# Patient Record
Sex: Female | Born: 1985 | Race: White | Hispanic: No | Marital: Married | Smoking: Former smoker
Health system: Southern US, Community
[De-identification: ages and names within clinical notes are randomized; demographics above are authoritative.]

## PROBLEM LIST (undated history)

## (undated) DIAGNOSIS — O24419 Gestational diabetes mellitus in pregnancy, unspecified control: Secondary | ICD-10-CM

---
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
## 2007-05-04 ENCOUNTER — Encounter: Admission: RE | Admit: 2007-05-04 | Discharge: 2007-05-04 | Payer: Self-pay | Admitting: Obstetrics and Gynecology

## 2007-05-21 ENCOUNTER — Ambulatory Visit: Payer: Self-pay | Admitting: Obstetrics & Gynecology

## 2007-05-28 ENCOUNTER — Ambulatory Visit: Payer: Self-pay | Admitting: Obstetrics and Gynecology

## 2007-06-04 ENCOUNTER — Ambulatory Visit: Payer: Self-pay | Admitting: Gynecology

## 2007-06-09 ENCOUNTER — Ambulatory Visit: Payer: Self-pay | Admitting: Obstetrics & Gynecology

## 2007-06-16 ENCOUNTER — Ambulatory Visit: Payer: Self-pay | Admitting: Gynecology

## 2007-06-20 ENCOUNTER — Encounter (INDEPENDENT_AMBULATORY_CARE_PROVIDER_SITE_OTHER): Payer: Self-pay | Admitting: Obstetrics and Gynecology

## 2007-06-20 ENCOUNTER — Inpatient Hospital Stay (HOSPITAL_COMMUNITY): Admission: AD | Admit: 2007-06-20 | Discharge: 2007-06-23 | Payer: Self-pay | Admitting: Obstetrics and Gynecology

## 2007-09-08 ENCOUNTER — Inpatient Hospital Stay (HOSPITAL_COMMUNITY): Admission: AD | Admit: 2007-09-08 | Discharge: 2007-09-08 | Payer: Self-pay | Admitting: Obstetrics and Gynecology

## 2010-07-23 NOTE — Op Note (Signed)
NAME:  Sara Macias, Sara Macias               ACCOUNT NO.:  [id number]   MEDICAL RECORD NO.:  [id number]          PATIENT TYPE:  INP   LOCATION:                                FACILITY:  [facility]   PHYSICIAN:  Juluis Mire, M.D.   DATE OF BIRTH:  1985/04/12   DATE OF PROCEDURE:  06/20/2007  DATE OF DISCHARGE:                               OPERATIVE REPORT   PREOPERATIVE DIAGNOSES:  1. Twin pregnancy at 35-1/2 weeks, with a breech transverse lie.  2. Spontaneous onset of labor.  3. Gestational diabetes.   POSTOPERATIVE DIAGNOSES:  1. Twin pregnancy at 35-1/2 weeks, with a breech transverse lie.  2. Spontaneous onset of labor.  3. Gestational diabetes.   OPERATIVE PROCEDURE:  Low transverse cesarean section.   SURGEON:  Juluis Mire, M.D.   ANESTHESIA:  Spinal.   ESTIMATED BLOOD LOSS:  500-600 mL.   PACKS AND DRAINS:  None.   INTRAOPERATIVE BLOOD REPLACEMENT:  None.   COMPLICATIONS:  None.   INDICATIONS:  The patient is a 25 year old primigravida female, with  twin gestation at 35-1/2 weeks; presenting with spontaneous onset of  labor.  Ultrasound confirmed breech transverse presentation.  The  decision was to proceed with a primary cesarean section.  The risks were  discussed, including the risk of infection, the risk of hemorrhage that  could require transfusion, with the risk of AIDS or hepatitis; the risk  of injury to adjacent organs, including bladder, bowel, ureters that  could require further exploratory surgery.  The risk of deep venous  thrombosis and pulmonary emboli.  The patient expressed understanding of  the indications and risks.   PROCEDURE:  The patient was taken to the OR and placed in the supine  position with a left lateral tilt.  After satisfactory level of spinal  anesthesia was obtained, the abdomen was prepped out with Betadine and  draped as a sterile field.  A low transverse skin incision was made with  a knife and carried through subcutaneous tissue.   The fascia was entered  sharply and the incision extended laterally.  The fascia was taken off  the muscle superiorly and inferiorly.  The rectus muscles were separated  in the midline.  The perineum was then sharply incised, and the perineum  extended both superiorly and inferiorly.  A bladder flap was developed.  A low transverse uterine incision was begun with a knife, and extended  laterally using manual traction.   Twin A presented in the breech presentation and delivered in the usual  manner.  Twin A was a female, who weighed 5 pounds 12 ounces; Apgars  were 8/8. Umbilical artery pH was 7.35.   Twin B was transverse; converted to a vertex.  Delivered with elevation  of head and fundal pressure.  That twin was a female, who weighed 5 pounds  15 ounces; Apgars were 9/9.  Umbilical artery pH was 7.36.   The placenta was delivered manually and sent to pathology.  The uterus  was exteriorized for closure.  The uterus was closed with a running,  locking suture of 0 Chromic --  using the 2-layer closure technique.  We  had good hemostasis.  Tubes and ovaries were unremarkable.  The uterus  was returned to the abdominal cavity.  The pelvic cavity was irrigated.  We had good hemostasis and clear urine output.  Muscles and peritoneum  were closed with running suture of 3-0 Vicryl.  The fascia was closed  with running suture of 0 PDS.  The skin was closed with staples and  Steri-Strips.  Sponge, instrument and needle count was reported correct  by circulating nurse x2.  Foley catheter remained clear at time of  closure.  The patient tolerated the procedure well and was returned to  the recovery room in good condition.      Juluis Mire, M.D.  Electronically Signed     JSM/MEDQ  D:  06/20/2007  T:  06/20/2007  Job:  [id number]

## 2010-07-26 NOTE — Discharge Summary (Signed)
Sara Macias, Sara Macias               ACCOUNT NO.:  [id number]   MEDICAL RECORD NO.:  [id number]          PATIENT TYPE:  INP   LOCATION:  9124                          FACILITY:  [facility]   PHYSICIAN:  Duke Salvia. Marcelle Overlie, M.D.DATE OF BIRTH:  1985/07/01   DATE OF ADMISSION:  06/20/2007  DATE OF DISCHARGE:  06/23/2007                               DISCHARGE SUMMARY   ADMITTING DIAGNOSES:  1. Intrauterine pregnancy at 35-1/2 weeks estimated gestational age.  2. Spontaneous onset of labor.  3. Twin gestation for gestational diabetes, diet-controlled.   DISCHARGE DIAGNOSES:  1. Status post low-transverse cesarean section.  2. Viable female and female twin infants.   PROCEDURE:  Primary low-transverse cesarean section.   REASON FOR ADMISSION:  Please see written H and P.   HOSPITAL COURSE:  The patient is a 25 year old primigravida that was  admitted to [facility] at 35-1/2 weeks estimated  gestational age with spontaneous onset of labor.  The patient had been  given terbutaline which she did not respond to.  Ultrasound was  performed which revealed that twin A was in the breech presentation.  Decision was made to proceed with a primary low-transverse cesarean  section.  The patient was then taken to the operating room where spinal  anesthesia was administered without difficulty.  A low-transverse  incision was made with the delivery of a viable twin A female infant  weighing 5 pounds 12 ounces and Apgars of 8 at 1 minute and 9 at 5  minutes.  Arterial cord pH was 7.35.  Also twin B female infant weighing 5  pounds 15 ounces and Apgars of 9 at 1 minute and 9 at 5 minutes.  Arterial cord pH was 7.36.  The patient tolerated the procedure well and  was taken to the recovery room in stable condition.  On postoperative  day #1, the patient did complain of some hunger, otherwise vital signs  were stable.  She was afebrile.  Abdomen soft with good return of bowel  function.  Fundus was  firm and nontender.  Abdominal dressing was noted  to be clean, dry, and intact.  Foley was draining with adequate amount  of urine output.  Laboratory findings revealed hemoglobin of 11.0,  platelet count of 98,000, WBC count of 10.1.  Motrin was held.  CBC was  ordered for the following morning and fasting and 2-hour postprandial  labs were ordered.  On the following morning, the patient was without  complaint.  Vital signs were stable.  She was afebrile.  Abdomen was  slightly distended, however, the patient was passing adequate amounts of  flatus.  Fundus was firm and nontender.  Abdominal dressing was noted to  be clean, dry, and intact.  Laboratory findings revealed a hemoglobin of  11.9, platelet count of 124,000, WBC count of 11.5, fasting blood sugar  was 94 mg/dL.  The patient was given a Dulcolax suppository and  increased warm beverages with ambulation on postoperative day #3.  The  patient was now without complaint.  Vital signs were stable.  She was  afebrile.  Abdomen was soft.  Fundus firm and nontender.  Incision was  clean, dry, and intact.  Staples were removed and the patient was later  discharged home.   CONDITION ON DISCHARGE:  Stable.   DIET:  Regular as tolerated.   ACTIVITY:  No heavy lifting, no driving x2 weeks, no vaginal entry.   FOLLOWUP:  The patient is to follow up in the office in 1-2 weeks for an  incision check.  She is to call for a temperature greater than 100  degrees, persistent nausea, vomiting, heavy vaginal bleeding, and/or  redness or drainage from the incisional site.   DISCHARGE MEDICATIONS:  1. Tylox #30 one p.o. every 4-6 hours p.r.n.  2. Motrin 600 mg every 6 hours.  3. Prenatal vitamins 1 p.o. daily.  4. Colace 1 p.o. daily.      Julio Sicks, N.P.      Richard M. Marcelle Overlie, M.D.  Electronically Signed    CC/MEDQ  D:  07/28/2007  T:  07/28/2007  Job:  [id number]

## 2010-12-03 LAB — URINALYSIS, ROUTINE W REFLEX MICROSCOPIC
Glucose, UA: NEGATIVE
Nitrite: NEGATIVE
Specific Gravity, Urine: 1.01
pH: 6.5

## 2010-12-03 LAB — CBC
HCT: 35.2 — ABNORMAL LOW
MCHC: 34.9
MCHC: 34.9
MCV: 84
MCV: 85.1
Platelets: 110 — ABNORMAL LOW
Platelets: 124 — ABNORMAL LOW
Platelets: 98 — ABNORMAL LOW
RBC: 3.76 — ABNORMAL LOW
RBC: 4.14
RDW: 15.4
WBC: 10.1
WBC: 11.5 — ABNORMAL HIGH

## 2010-12-03 LAB — URINE MICROSCOPIC-ADD ON

## 2011-07-28 ENCOUNTER — Other Ambulatory Visit: Payer: Self-pay | Admitting: Dermatology

## 2011-08-11 ENCOUNTER — Other Ambulatory Visit: Payer: Self-pay | Admitting: Dermatology

## 2013-09-25 ENCOUNTER — Other Ambulatory Visit: Payer: Self-pay

## 2013-10-05 ENCOUNTER — Ambulatory Visit: Payer: BC Managed Care – PPO

## 2013-10-28 ENCOUNTER — Other Ambulatory Visit (HOSPITAL_COMMUNITY): Payer: Self-pay | Admitting: Obstetrics and Gynecology

## 2013-10-28 DIAGNOSIS — O283 Abnormal ultrasonic finding on antenatal screening of mother: Secondary | ICD-10-CM

## 2013-11-04 ENCOUNTER — Ambulatory Visit (HOSPITAL_COMMUNITY)
Admission: RE | Admit: 2013-11-04 | Discharge: 2013-11-04 | Disposition: A | Discharge status: Home / Self Care | Payer: BC Managed Care – PPO | Source: Ambulatory Visit | Attending: Obstetrics and Gynecology | Admitting: Obstetrics and Gynecology

## 2013-11-04 ENCOUNTER — Encounter (HOSPITAL_COMMUNITY): Payer: Self-pay

## 2013-11-04 VITALS — BP 117/69 | HR 76 | Wt 230.0 lb

## 2013-11-04 DIAGNOSIS — Z794 Long term (current) use of insulin: Secondary | ICD-10-CM | POA: Insufficient documentation

## 2013-11-04 DIAGNOSIS — O283 Abnormal ultrasonic finding on antenatal screening of mother: Secondary | ICD-10-CM

## 2013-11-04 DIAGNOSIS — O99340 Other mental disorders complicating pregnancy, unspecified trimester: Secondary | ICD-10-CM | POA: Insufficient documentation

## 2013-11-04 DIAGNOSIS — O358XX0 Maternal care for other (suspected) fetal abnormality and damage, not applicable or unspecified: Secondary | ICD-10-CM | POA: Insufficient documentation

## 2013-11-04 DIAGNOSIS — O289 Unspecified abnormal findings on antenatal screening of mother: Secondary | ICD-10-CM | POA: Diagnosis not present

## 2013-11-04 DIAGNOSIS — O24419 Gestational diabetes mellitus in pregnancy, unspecified control: Secondary | ICD-10-CM

## 2013-11-04 HISTORY — DX: Gestational diabetes mellitus in pregnancy, unspecified control: O24.419

## 2013-11-26 ENCOUNTER — Encounter (HOSPITAL_COMMUNITY): Payer: BC Managed Care – PPO | Admitting: Anesthesiology

## 2013-11-26 ENCOUNTER — Encounter (HOSPITAL_COMMUNITY): Payer: Self-pay

## 2013-11-26 ENCOUNTER — Inpatient Hospital Stay (HOSPITAL_COMMUNITY): Payer: BC Managed Care – PPO | Admitting: Anesthesiology

## 2013-11-26 ENCOUNTER — Encounter (HOSPITAL_COMMUNITY)
Admission: AD | Disposition: A | Discharge status: Home / Self Care | Payer: Self-pay | Source: Ambulatory Visit | Attending: Obstetrics and Gynecology

## 2013-11-26 ENCOUNTER — Inpatient Hospital Stay (HOSPITAL_COMMUNITY)
Admission: AD | Admit: 2013-11-26 | Discharge: 2013-11-29 | DRG: 766 | Disposition: A | Discharge status: Home / Self Care | Payer: BC Managed Care – PPO | Source: Ambulatory Visit | Attending: Obstetrics and Gynecology | Admitting: Obstetrics and Gynecology

## 2013-11-26 DIAGNOSIS — O4290 Premature rupture of membranes, unspecified as to length of time between rupture and onset of labor, unspecified weeks of gestation: Secondary | ICD-10-CM | POA: Diagnosis present

## 2013-11-26 DIAGNOSIS — Z3490 Encounter for supervision of normal pregnancy, unspecified, unspecified trimester: Secondary | ICD-10-CM

## 2013-11-26 DIAGNOSIS — O34219 Maternal care for unspecified type scar from previous cesarean delivery: Secondary | ICD-10-CM | POA: Diagnosis present

## 2013-11-26 DIAGNOSIS — O99814 Abnormal glucose complicating childbirth: Secondary | ICD-10-CM | POA: Diagnosis present

## 2013-11-26 DIAGNOSIS — Z87891 Personal history of nicotine dependence: Secondary | ICD-10-CM | POA: Diagnosis not present

## 2013-11-26 DIAGNOSIS — E669 Obesity, unspecified: Secondary | ICD-10-CM | POA: Diagnosis present

## 2013-11-26 DIAGNOSIS — Z6837 Body mass index (BMI) 37.0-37.9, adult: Secondary | ICD-10-CM | POA: Diagnosis not present

## 2013-11-26 DIAGNOSIS — O99214 Obesity complicating childbirth: Secondary | ICD-10-CM

## 2013-11-26 LAB — CBC WITH DIFFERENTIAL/PLATELET
BASOS ABS: 0 10*3/uL (ref 0.0–0.1)
BASOS PCT: 0 % (ref 0–1)
EOS ABS: 0.1 10*3/uL (ref 0.0–0.7)
Eosinophils Relative: 1 % (ref 0–5)
HCT: 34.7 % — ABNORMAL LOW (ref 36.0–46.0)
Hemoglobin: 11.9 g/dL — ABNORMAL LOW (ref 12.0–15.0)
Lymphocytes Relative: 14 % (ref 12–46)
Lymphs Abs: 1.3 10*3/uL (ref 0.7–4.0)
MCH: 28.6 pg (ref 26.0–34.0)
MCHC: 34.3 g/dL (ref 30.0–36.0)
MCV: 83.4 fL (ref 78.0–100.0)
Monocytes Absolute: 0.4 10*3/uL (ref 0.1–1.0)
Monocytes Relative: 5 % (ref 3–12)
NEUTROS PCT: 80 % — AB (ref 43–77)
Neutro Abs: 7 10*3/uL (ref 1.7–7.7)
PLATELETS: 122 10*3/uL — AB (ref 150–400)
RBC: 4.16 MIL/uL (ref 3.87–5.11)
RDW: 14.6 % (ref 11.5–15.5)
WBC: 8.7 10*3/uL (ref 4.0–10.5)

## 2013-11-26 LAB — GLUCOSE, CAPILLARY
GLUCOSE-CAPILLARY: 63 mg/dL — AB (ref 70–99)
Glucose-Capillary: 62 mg/dL — ABNORMAL LOW (ref 70–99)

## 2013-11-26 LAB — TYPE AND SCREEN
ABO/RH(D): B POS
Antibody Screen: NEGATIVE

## 2013-11-26 LAB — ABO/RH: ABO/RH(D): B POS

## 2013-11-26 LAB — RPR

## 2013-11-26 SURGERY — Surgical Case
Anesthesia: Spinal | Site: Abdomen

## 2013-11-26 MED ORDER — DIPHENHYDRAMINE HCL 25 MG PO CAPS: 25.0000 mg | ORAL_CAPSULE | ORAL | Status: DC | PRN

## 2013-11-26 MED ORDER — LACTATED RINGERS IV SOLN
INTRAVENOUS | Status: DC | PRN
  Administered 2013-11-26 (×2): via INTRAVENOUS

## 2013-11-26 MED ORDER — FENTANYL CITRATE 0.05 MG/ML IJ SOLN
INTRAMUSCULAR | Status: AC
  Filled 2013-11-26: qty 2

## 2013-11-26 MED ORDER — MENTHOL 3 MG MT LOZG: 1.0000 | LOZENGE | OROMUCOSAL | Status: DC | PRN

## 2013-11-26 MED ORDER — NALBUPHINE HCL 10 MG/ML IJ SOLN: 5.0000 mg | Freq: Once | INTRAMUSCULAR | Status: AC | PRN

## 2013-11-26 MED ORDER — ZOLPIDEM TARTRATE 5 MG PO TABS: 5.0000 mg | ORAL_TABLET | Freq: Every evening | ORAL | Status: DC | PRN

## 2013-11-26 MED ORDER — FENTANYL CITRATE 0.05 MG/ML IJ SOLN
INTRAMUSCULAR | Status: DC | PRN
  Administered 2013-11-26: 25 ug via INTRATHECAL

## 2013-11-26 MED ORDER — LACTATED RINGERS IV SOLN
INTRAVENOUS | Status: DC | PRN
  Administered 2013-11-26: 17:00:00 via INTRAVENOUS

## 2013-11-26 MED ORDER — SCOPOLAMINE 1 MG/3DAYS TD PT72
1.0000 | MEDICATED_PATCH | Freq: Once | TRANSDERMAL | Status: DC
  Administered 2013-11-26: 1.5 mg via TRANSDERMAL

## 2013-11-26 MED ORDER — SODIUM CHLORIDE 0.9 % IJ SOLN: 3.0000 mL | INTRAMUSCULAR | Status: DC | PRN

## 2013-11-26 MED ORDER — WITCH HAZEL-GLYCERIN EX PADS: 1.0000 "application " | MEDICATED_PAD | CUTANEOUS | Status: DC | PRN

## 2013-11-26 MED ORDER — CEFAZOLIN SODIUM-DEXTROSE 2-3 GM-% IV SOLR
2.0000 g | INTRAVENOUS | Status: AC
  Administered 2013-11-26: 2 g via INTRAVENOUS
  Filled 2013-11-26: qty 50

## 2013-11-26 MED ORDER — FLEET ENEMA 7-19 GM/118ML RE ENEM: 1.0000 | ENEMA | Freq: Every day | RECTAL | Status: DC | PRN

## 2013-11-26 MED ORDER — NALBUPHINE HCL 10 MG/ML IJ SOLN: 5.0000 mg | INTRAMUSCULAR | Status: DC | PRN

## 2013-11-26 MED ORDER — SENNOSIDES-DOCUSATE SODIUM 8.6-50 MG PO TABS
2.0000 | ORAL_TABLET | ORAL | Status: DC
  Administered 2013-11-26 – 2013-11-29 (×3): 2 via ORAL
  Filled 2013-11-26 (×3): qty 2

## 2013-11-26 MED ORDER — MEASLES, MUMPS & RUBELLA VAC ~~LOC~~ INJ
0.5000 mL | INJECTION | Freq: Once | SUBCUTANEOUS | Status: DC
  Filled 2013-11-26: qty 0.5

## 2013-11-26 MED ORDER — SODIUM CHLORIDE 0.9 % IJ SOLN: 3.0000 mL | Freq: Two times a day (BID) | INTRAMUSCULAR | Status: DC

## 2013-11-26 MED ORDER — MEPERIDINE HCL 25 MG/ML IJ SOLN: 6.2500 mg | INTRAMUSCULAR | Status: DC | PRN

## 2013-11-26 MED ORDER — CITRIC ACID-SODIUM CITRATE 334-500 MG/5ML PO SOLN
30.0000 mL | Freq: Once | ORAL | Status: AC
  Administered 2013-11-26: 30 mL via ORAL
  Filled 2013-11-26: qty 15

## 2013-11-26 MED ORDER — PHENYLEPHRINE 8 MG IN D5W 100 ML (0.08MG/ML) PREMIX OPTIME
INJECTION | INTRAVENOUS | Status: DC | PRN
  Administered 2013-11-26: 60 ug/min via INTRAVENOUS

## 2013-11-26 MED ORDER — ONDANSETRON HCL 4 MG PO TABS: 4.0000 mg | ORAL_TABLET | ORAL | Status: DC | PRN

## 2013-11-26 MED ORDER — HYDROMORPHONE HCL 1 MG/ML IJ SOLN
0.2500 mg | INTRAMUSCULAR | Status: DC | PRN
  Administered 2013-11-26 (×2): 0.5 mg via INTRAVENOUS

## 2013-11-26 MED ORDER — OXYTOCIN 10 UNIT/ML IJ SOLN
40.0000 [IU] | INTRAVENOUS | Status: DC | PRN
  Administered 2013-11-26: 40 [IU] via INTRAVENOUS

## 2013-11-26 MED ORDER — LACTATED RINGERS IV SOLN
INTRAVENOUS | Status: DC
  Administered 2013-11-26 – 2013-11-27 (×2): via INTRAVENOUS

## 2013-11-26 MED ORDER — NALOXONE HCL 0.4 MG/ML IJ SOLN: 0.4000 mg | INTRAMUSCULAR | Status: DC | PRN

## 2013-11-26 MED ORDER — MORPHINE SULFATE (PF) 0.5 MG/ML IJ SOLN
INTRAMUSCULAR | Status: DC | PRN
  Administered 2013-11-26: .1 mg via INTRATHECAL

## 2013-11-26 MED ORDER — DIPHENHYDRAMINE HCL 25 MG PO CAPS: 25.0000 mg | ORAL_CAPSULE | Freq: Four times a day (QID) | ORAL | Status: DC | PRN

## 2013-11-26 MED ORDER — OXYCODONE-ACETAMINOPHEN 5-325 MG PO TABS
2.0000 | ORAL_TABLET | ORAL | Status: DC | PRN
  Administered 2013-11-27: 1 via ORAL
  Administered 2013-11-28 (×2): 2 via ORAL
  Filled 2013-11-26 (×2): qty 2

## 2013-11-26 MED ORDER — DIPHENHYDRAMINE HCL 50 MG/ML IJ SOLN: 12.5000 mg | INTRAMUSCULAR | Status: DC | PRN

## 2013-11-26 MED ORDER — PRENATAL MULTIVITAMIN CH
1.0000 | ORAL_TABLET | Freq: Every day | ORAL | Status: DC
  Administered 2013-11-27: 1 via ORAL
  Filled 2013-11-26 (×2): qty 1

## 2013-11-26 MED ORDER — OXYTOCIN 10 UNIT/ML IJ SOLN
INTRAMUSCULAR | Status: AC
  Filled 2013-11-26: qty 4

## 2013-11-26 MED ORDER — OXYTOCIN 40 UNITS IN LACTATED RINGERS INFUSION - SIMPLE MED: 62.5000 mL/h | INTRAVENOUS | Status: AC

## 2013-11-26 MED ORDER — FAMOTIDINE IN NACL 20-0.9 MG/50ML-% IV SOLN
20.0000 mg | Freq: Once | INTRAVENOUS | Status: AC
  Administered 2013-11-26: 20 mg via INTRAVENOUS
  Filled 2013-11-26: qty 50

## 2013-11-26 MED ORDER — PHENYLEPHRINE 8 MG IN D5W 100 ML (0.08MG/ML) PREMIX OPTIME
INJECTION | INTRAVENOUS | Status: AC
  Filled 2013-11-26: qty 100

## 2013-11-26 MED ORDER — DIBUCAINE 1 % RE OINT: 1.0000 "application " | TOPICAL_OINTMENT | RECTAL | Status: DC | PRN

## 2013-11-26 MED ORDER — SODIUM CHLORIDE 0.9 % IV SOLN: 250.0000 mL | INTRAVENOUS | Status: DC

## 2013-11-26 MED ORDER — SIMETHICONE 80 MG PO CHEW: 80.0000 mg | CHEWABLE_TABLET | ORAL | Status: DC | PRN

## 2013-11-26 MED ORDER — ONDANSETRON HCL 4 MG/2ML IJ SOLN: 4.0000 mg | Freq: Three times a day (TID) | INTRAMUSCULAR | Status: DC | PRN

## 2013-11-26 MED ORDER — SIMETHICONE 80 MG PO CHEW
80.0000 mg | CHEWABLE_TABLET | Freq: Three times a day (TID) | ORAL | Status: DC
  Administered 2013-11-27 – 2013-11-29 (×5): 80 mg via ORAL
  Filled 2013-11-26 (×6): qty 1

## 2013-11-26 MED ORDER — IBUPROFEN 800 MG PO TABS
800.0000 mg | ORAL_TABLET | Freq: Three times a day (TID) | ORAL | Status: DC | PRN
  Administered 2013-11-26 – 2013-11-28 (×5): 800 mg via ORAL
  Filled 2013-11-26 (×6): qty 1

## 2013-11-26 MED ORDER — TETANUS-DIPHTH-ACELL PERTUSSIS 5-2.5-18.5 LF-MCG/0.5 IM SUSP
0.5000 mL | Freq: Once | INTRAMUSCULAR | Status: DC
Start: 2013-11-27 — End: 2013-11-29

## 2013-11-26 MED ORDER — SCOPOLAMINE 1 MG/3DAYS TD PT72
MEDICATED_PATCH | TRANSDERMAL | Status: AC
  Filled 2013-11-26: qty 1

## 2013-11-26 MED ORDER — ONDANSETRON HCL 4 MG/2ML IJ SOLN
INTRAMUSCULAR | Status: DC | PRN
  Administered 2013-11-26: 4 mg via INTRAVENOUS

## 2013-11-26 MED ORDER — MORPHINE SULFATE 0.5 MG/ML IJ SOLN
INTRAMUSCULAR | Status: AC
  Filled 2013-11-26: qty 10

## 2013-11-26 MED ORDER — NALOXONE HCL 1 MG/ML IJ SOLN
1.0000 ug/kg/h | INTRAVENOUS | Status: DC | PRN
  Filled 2013-11-26: qty 2

## 2013-11-26 MED ORDER — ONDANSETRON HCL 4 MG/2ML IJ SOLN: 4.0000 mg | INTRAMUSCULAR | Status: DC | PRN

## 2013-11-26 MED ORDER — ONDANSETRON HCL 4 MG/2ML IJ SOLN
INTRAMUSCULAR | Status: AC
  Filled 2013-11-26: qty 2

## 2013-11-26 MED ORDER — OXYCODONE-ACETAMINOPHEN 5-325 MG PO TABS
1.0000 | ORAL_TABLET | ORAL | Status: DC | PRN
  Administered 2013-11-27 – 2013-11-28 (×4): 1 via ORAL
  Administered 2013-11-29: 2 via ORAL
  Administered 2013-11-29: 1 via ORAL
  Filled 2013-11-26 (×8): qty 1

## 2013-11-26 MED ORDER — KETOROLAC TROMETHAMINE 30 MG/ML IJ SOLN
INTRAMUSCULAR | Status: AC
  Administered 2013-11-26: 30 mg via INTRAVENOUS
  Filled 2013-11-26: qty 1

## 2013-11-26 MED ORDER — BISACODYL 10 MG RE SUPP: 10.0000 mg | Freq: Every day | RECTAL | Status: DC | PRN

## 2013-11-26 MED ORDER — SIMETHICONE 80 MG PO CHEW
80.0000 mg | CHEWABLE_TABLET | ORAL | Status: DC
  Administered 2013-11-26 – 2013-11-29 (×3): 80 mg via ORAL
  Filled 2013-11-26 (×3): qty 1

## 2013-11-26 MED ORDER — KETOROLAC TROMETHAMINE 30 MG/ML IJ SOLN: 30.0000 mg | Freq: Four times a day (QID) | INTRAMUSCULAR | Status: AC | PRN

## 2013-11-26 MED ORDER — LANOLIN HYDROUS EX OINT: 1.0000 "application " | TOPICAL_OINTMENT | CUTANEOUS | Status: DC | PRN

## 2013-11-26 MED ORDER — KETOROLAC TROMETHAMINE 30 MG/ML IJ SOLN
30.0000 mg | Freq: Four times a day (QID) | INTRAMUSCULAR | Status: AC | PRN
  Administered 2013-11-26: 30 mg via INTRAVENOUS

## 2013-11-26 MED ORDER — HYDROMORPHONE HCL 1 MG/ML IJ SOLN
INTRAMUSCULAR | Status: AC
  Administered 2013-11-26: 0.5 mg via INTRAVENOUS
  Filled 2013-11-26: qty 1

## 2013-11-26 SURGICAL SUPPLY — 28 items
BENZOIN TINCTURE PRP APPL 2/3 (GAUZE/BANDAGES/DRESSINGS) ×2 IMPLANT
BLADE SURG 10 STRL SS (BLADE) ×4 IMPLANT
CLAMP CORD UMBIL (MISCELLANEOUS) IMPLANT
CLOTH BEACON ORANGE TIMEOUT ST (SAFETY) ×2 IMPLANT
DRAPE SHEET LG 3/4 BI-LAMINATE (DRAPES) IMPLANT
DRSG OPSITE POSTOP 4X10 (GAUZE/BANDAGES/DRESSINGS) ×2 IMPLANT
DURAPREP 26ML APPLICATOR (WOUND CARE) ×2 IMPLANT
ELECT REM PT RETURN 9FT ADLT (ELECTROSURGICAL) ×2
ELECTRODE REM PT RTRN 9FT ADLT (ELECTROSURGICAL) ×1 IMPLANT
EXTRACTOR VACUUM M CUP 4 TUBE (SUCTIONS) IMPLANT
GLOVE BIO SURGEON STRL SZ7 (GLOVE) ×2 IMPLANT
GOWN STRL REUS W/TWL LRG LVL3 (GOWN DISPOSABLE) ×4 IMPLANT
KIT ABG SYR 3ML LUER SLIP (SYRINGE) IMPLANT
NEEDLE HYPO 25X5/8 SAFETYGLIDE (NEEDLE) ×2 IMPLANT
NS IRRIG 1000ML POUR BTL (IV SOLUTION) ×2 IMPLANT
PACK C SECTION WH (CUSTOM PROCEDURE TRAY) ×2 IMPLANT
PAD OB MATERNITY 4.3X12.25 (PERSONAL CARE ITEMS) ×2 IMPLANT
STRIP CLOSURE SKIN 1/2X4 (GAUZE/BANDAGES/DRESSINGS) ×2 IMPLANT
SUT CHROMIC 0 CTX 36 (SUTURE) ×6 IMPLANT
SUT MNCRL AB 4-0 PS2 18 (SUTURE) ×2 IMPLANT
SUT MON AB 4-0 PS1 27 (SUTURE) ×2 IMPLANT
SUT PDS AB 0 CT1 27 (SUTURE) ×4 IMPLANT
SUT VIC AB 3-0 CT1 27 (SUTURE) ×2
SUT VIC AB 3-0 CT1 TAPERPNT 27 (SUTURE) ×2 IMPLANT
SUT VIC AB 4-0 PS2 27 (SUTURE) ×2 IMPLANT
TOWEL OR 17X24 6PK STRL BLUE (TOWEL DISPOSABLE) ×2 IMPLANT
TRAY FOLEY CATH 14FR (SET/KITS/TRAYS/PACK) ×2 IMPLANT
WATER STERILE IRR 1000ML POUR (IV SOLUTION) ×2 IMPLANT

## 2013-11-26 NOTE — Anesthesia Preprocedure Evaluation (Signed)
Anesthesia Evaluation  Patient identified by MRN, date of birth, ID band Patient awake    Reviewed: Allergy & Precautions, H&P , Patient's Chart, lab work & pertinent test results  Airway Mallampati: II  TM Distance: >3 FB Neck ROM: full    Dental no notable dental hx.    Pulmonary former smoker,  breath sounds clear to auscultation  Pulmonary exam normal       Cardiovascular Exercise Tolerance: Good Rhythm:regular Rate:Normal     Neuro/Psych    GI/Hepatic   Endo/Other  diabetes, GestationalMorbid obesity  Renal/GU      Musculoskeletal   Abdominal   Peds  Hematology   Anesthesia Other Findings   Reproductive/Obstetrics                           Anesthesia Physical Anesthesia Plan  ASA: III  Anesthesia Plan: Spinal   Post-op Pain Management:    Induction:   Airway Management Planned:   Additional Equipment:   Intra-op Plan:   Post-operative Plan:   Informed Consent: I have reviewed the patients History and Physical, chart, labs and discussed the procedure including the risks, benefits and alternatives for the proposed anesthesia with the patient or authorized representative who has indicated his/her understanding and acceptance.   Dental Advisory Given  Plan Discussed with: CRNA  Anesthesia Plan Comments: (Lab work confirmed with CRNA in room. Platelets okay. Discussed spinal anesthetic, and patient consents to the procedure:  included risk of possible headache,backache, failed block, allergic reaction, and nerve injury. This patient was asked if she had any questions or concerns before the procedure started. )        Anesthesia Quick Evaluation  

## 2013-11-26 NOTE — Transfer of Care (Signed)
Immediate Anesthesia Transfer of Care Note  Patient: Sara Macias  Procedure(s) Performed: Procedure(s): CESAREAN SECTION (N/A)  Patient Location: PACU  Anesthesia Type:Spinal  Level of Consciousness: awake, alert  and oriented  Airway & Oxygen Therapy: Patient Spontanous Breathing  Post-op Assessment: Report given to PACU RN and Post -op Vital signs reviewed and stable  Post vital signs: Reviewed and stable  Complications: No apparent anesthesia complications

## 2013-11-26 NOTE — Anesthesia Postprocedure Evaluation (Signed)
Anesthesia Post Note  Patient: Sara Macias  Procedure(s) Performed: Procedure(s) (LRB): CESAREAN SECTION (N/A)  Anesthesia type: Spinal  Patient location: PACU  Post pain: Pain level controlled  Post assessment: Post-op Vital signs reviewed  Last Vitals:  Filed Vitals:   11/26/13 1815  BP:   Pulse: 61  Temp:   Resp: 16    Post vital signs: Reviewed  Level of consciousness: awake  Complications: No apparent anesthesia complications

## 2013-11-26 NOTE — MAU Note (Signed)
CBG = /dl.

## 2013-11-26 NOTE — Consult Note (Signed)
Neonatology Note:   Attendance at C-section:    I was asked by Dr. Marcelle Overlie to attend this repeat C/S at 36 5/[redacted] weeks GA due to onset of labor. The mother is a G2P1 B pos, GBS neg with GDM, on glyburide. ROM at delivery, fluid clear. Infant vigorous with good spontaneous cry and tone. Needed only minimal bulb suctioning. Ap 8/9. Lungs clear to ausc in DR. To CN to care of Pediatrician.   Doretha Sou, MD

## 2013-11-26 NOTE — Op Note (Signed)
Preoperative diagnosis:[redacted]w[redacted]d week IUP, rupture of membranes, previous cesarean section, declines trial of labor  Postoperative diagnosis: Same  Procedure: Repeat low transverse cesarean section  Surgeon: Marcelle Overlie  Anesthesia: Spinal  EBL: 700 cc  Complications: None  Procedure and findings:  The patient taken the operating room after an adequate level of spinal anesthetic was obtained with the patient in left tilt position the abdomen prepped and draped in the usual fashion, Foley catheter positioned. Appropriate timeout taken at that point.   Transverse incision made tibial scar which is fairly well-healed this is carried down to the fascia which was incised and extended transversely. Rectus muscles divided in the midline, peritoneum entered superiorly without incident and extended in a vertical fashion. The vesicouterine serosa was incised and the bladder was bluntly and sharply dissected below. Bladder blade repositioned. Transverse incision made in the lower segment extended with blunt dissection clear fluid noted. The patient then delivered of a healthy infant Apgars 9 and 9, loose nuchal cord x1. The infant was suctioned, cord clamped and passed to the pediatric team for further care. The placenta was then delivered manually intact, uterus exteriorized cavity wiped clean with a laparotomy pack closure obtained the first layer of 0 chromic in a locked fashion followed by an imbricating layer of 0 chromic. This is hemostatic bladder flap area intact and hemostatic, bilateral tubes and ovaries were normal. Prior to closure, sponge, needle, instrument counts reported as correct x2. Peritoneum was enclose a running 2-0 Vicryl suture. 2-0 Vicryl running suture used to reapproximate the rectus muscles in the midline. A 0 PDS was then used to close the fascia from laterally to midline on either side. Subcutaneous tissue was irrigated noted be hemostatic was only moderate in thickness I'll with very  little dead space noted. It was not closed separately. 4-0 Monocryl subcuticular closure with pressure dressing in a honeycomb applied clear urine noted in the case she tolerated this well went to recovery room in good condition.  Dictated with [facility]  Travion Ke M. Milana Obey.D.

## 2013-11-26 NOTE — H&P (Signed)
Sara Macias  DICTATION # [id number] CSN# [id number]   Meriel Pica, MD 11/26/2013 1:00 PM

## 2013-11-26 NOTE — Anesthesia Procedure Notes (Signed)

## 2013-11-26 NOTE — Progress Notes (Signed)
The patient was re-examined with no change in status 

## 2013-11-26 NOTE — MAU Note (Signed)
Pt states water broke one hour ago, clear fluid. Is repeat c/s on 12/12/2013.

## 2013-11-27 DIAGNOSIS — Z3490 Encounter for supervision of normal pregnancy, unspecified, unspecified trimester: Secondary | ICD-10-CM

## 2013-11-27 LAB — CBC
HCT: 30.5 % — ABNORMAL LOW (ref 36.0–46.0)
Hemoglobin: 10.1 g/dL — ABNORMAL LOW (ref 12.0–15.0)
MCH: 27.9 pg (ref 26.0–34.0)
MCHC: 33.1 g/dL (ref 30.0–36.0)
MCV: 84.3 fL (ref 78.0–100.0)
PLATELETS: 99 10*3/uL — AB (ref 150–400)
RBC: 3.62 MIL/uL — AB (ref 3.87–5.11)
RDW: 14.8 % (ref 11.5–15.5)
WBC: 9.3 10*3/uL (ref 4.0–10.5)

## 2013-11-27 NOTE — Progress Notes (Signed)
Subjective: Postpartum Day 1: Cesarean Delivery Patient reports tolerating PO.    Objective: Vital signs in last 24 hours: Temp:  [97.3 F (36.3 C)-98.8 F (37.1 C)] 98 F (36.7 C) (09/20 0352) Pulse Rate:  [55-106] 106 (09/20 0400) Resp:  [14-26] 17 (09/20 0400) BP: (89-119)/(46-79) 106/65 mmHg (09/20 0400) SpO2:  [92 %-98 %] 97 % (09/20 0352) Weight:  [232 lb (105.235 kg)] 232 lb (105.235 kg) (09/19 0946)  Physical Exam:  General: alert Lochia: appropriate Uterine Fundus: firm Incision: healing well DVT Evaluation: No evidence of DVT seen on physical exam.   Recent Labs  11/26/13 1050 11/27/13 0552  HGB 11.9* 10.1*  HCT 34.7* 30.5*   CBC    Component Value Date/Time   WBC 9.3 11/27/2013 0552   RBC 3.62* 11/27/2013 0552   HGB 10.1* 11/27/2013 0552   HCT 30.5* 11/27/2013 0552   PLT 99* 11/27/2013 0552   MCV 84.3 11/27/2013 0552   MCH 27.9 11/27/2013 0552   MCHC 33.1 11/27/2013 0552   RDW 14.8 11/27/2013 0552   LYMPHSABS 1.3 11/26/2013 1050   MONOABS 0.4 11/26/2013 1050   EOSABS 0.1 11/26/2013 1050   BASOSABS 0.0 11/26/2013 1050     Assessment/Plan: Status post Cesarean section. Doing well postoperatively.  Continue current care.  Arda Daggs M 11/27/2013, 8:24 AM

## 2013-11-27 NOTE — Addendum Note (Signed)
Addendum created 11/27/13 0902 by Shanon Payor, CRNA   Modules edited: Notes Section   Notes Section:  File: [id number]

## 2013-11-27 NOTE — Anesthesia Postprocedure Evaluation (Signed)
  Anesthesia Post-op Note  Patient: Sara Macias  Procedure(s) Performed: Procedure(s): CESAREAN SECTION (N/A)  Patient Location: Mother/Baby  Anesthesia Type:Spinal  Level of Consciousness: awake, alert  and oriented  Airway and Oxygen Therapy: Patient Spontanous Breathing  Post-op Pain: none  Post-op Assessment: Post-op Vital signs reviewed, Patient's Cardiovascular Status Stable, Respiratory Function Stable, No headache, No backache, No residual numbness and No residual motor weakness  Post-op Vital Signs: Reviewed and stable  Last Vitals:  Filed Vitals:   11/27/13 0400  BP: 106/65  Pulse: 106  Temp:   Resp: 17    Complications: No apparent anesthesia complications

## 2013-11-27 NOTE — Lactation Note (Signed)
This note was copied from the chart of Sara Macias. Lactation Consultation Note  Patient Name: Sara Macias [id number] Date: 11/27/2013 Reason for consult: Initial assessment Mom reports baby is nursing well. She reports some mild nipple tenderness, denies breakdown. Mom reports she feels this was from the pump, she changed her flange to size 27 and reports this feels better. Mom is supplementing after feedings. LPT hand out given to Mom with supplementing guidelines. Discussed late preterm behaviors, Mom's twins were 35 wks. Basic teaching reviewed. Plan discussed with Mom is to BF with each feeding for up to 30 minutes, let FOB give supplement while she pumps. Advised to apply EBM for nipple tenderness. Lactation brochure left for review, advised of OP services and support group. Lactation supplement to support breast milk discussed with Mom. Encouraged to call for assist as needed.   Maternal Data Formula Feeding for Exclusion: Yes Reason for exclusion: Mother's choice to formula and breast feed on admission Has patient been taught Hand Expression?: No (Mom reports she knows how to hand express) Does the patient have breastfeeding experience prior to this delivery?: Yes  Feeding Feeding Type: Breast Fed Length of feed: 25 min  LATCH Score/Interventions                      Lactation Tools Discussed/Used Tools: Pump Breast pump type: Double-Electric Breast Pump   Consult Status Consult Status: Follow-up Date: 11/28/13 Follow-up type: In-patient    Alfred Levins 11/27/2013, 4:49 PM

## 2013-11-28 ENCOUNTER — Encounter (HOSPITAL_COMMUNITY): Payer: Self-pay | Admitting: Obstetrics and Gynecology

## 2013-11-28 LAB — CBC
HEMATOCRIT: 29.3 % — AB (ref 36.0–46.0)
Hemoglobin: 9.7 g/dL — ABNORMAL LOW (ref 12.0–15.0)
MCH: 28.1 pg (ref 26.0–34.0)
MCHC: 33.1 g/dL (ref 30.0–36.0)
MCV: 84.9 fL (ref 78.0–100.0)
PLATELETS: 97 10*3/uL — AB (ref 150–400)
RBC: 3.45 MIL/uL — AB (ref 3.87–5.11)
RDW: 14.8 % (ref 11.5–15.5)
WBC: 9.4 10*3/uL (ref 4.0–10.5)

## 2013-11-28 NOTE — H&P (Signed)
NAMECASSIDIE, Sara Macias NO.:  [id number]  MEDICAL RECORD NO.:  [id number]  LOCATION:                                 FACILITY:  PHYSICIAN:  Duke Salvia. Marcelle Overlie, M.D.DATE OF BIRTH:  [birth year]  DATE OF ADMISSION:  11/26/2013 DATE OF DISCHARGE:                             HISTORY & PHYSICAL   CHIEF COMPLAINT:  Rupture of membranes at term.  HISTORY OF PRESENT ILLNESS:  A 28 year old, G2, P1, L3.  Previous cesarean section for twins, she was a scheduled repeat cesarean section this pregnancy.  She presents today with rupture of membrane, clear fluid, early labor, rupture of membranes confirmed in MAU.  Prenatal course had been complicated by gestational diabetes requiring glyburide with good CBG control.  She declines VBAC, the procedure of repeat cesarean section including specific risks related to bleeding, infection, transfusion, wound infection, phlebitis along with her expected recovery time discussed with her which she understands and accepts.  PAST MEDICAL HISTORY:  Please see the [facility] form for details.  PHYSICAL EXAMINATION:  VITAL SIGNS:  Temp 98.2, blood pressure 130/78. HEENT:  Unremarkable. NECK:  Supple without masses. LUNGS:  Clear. CARDIOVASCULAR:  Regular rate and rhythm without murmurs, rubs, or gallops noted. BREASTS:  Not examined. PELVIC:  Term fundal height.  Fetal heart rate 140, cervix was a fingertip.  Clear fluid noted. EXTREMITIES:  Unremarkable. NEUROLOGIC:  Unremarkable.  IMPRESSION:  Term pregnancy, previous cesarean section for repeat, rupture of membranes with early labor.  PLAN:  Repeat cesarean section.  Procedure and risks are discussed as above.     Jemery Stacey M. Marcelle Overlie, M.D.     RMH/MEDQ  D:  11/26/2013  T:  11/26/2013  Job:  [id number]

## 2013-11-28 NOTE — Progress Notes (Signed)
Subjective: Postpartum Day 2: Cesarean Delivery Patient reports incisional pain, tolerating PO, + flatus and no problems voiding.  Reports baby undergoing ultrasound today for surveillance of infant ovarian cyst  Objective: Vital signs in last 24 hours: Temp:  [97.4 F (36.3 C)-98.8 F (37.1 C)] 97.4 F (36.3 C) (09/21 0645) Pulse Rate:  [61-74] 62 (09/21 0645) Resp:  [18] 18 (09/21 0645) BP: (100-113)/(52-64) 100/52 mmHg (09/21 0645) SpO2:  [99 %] 99 % (09/20 1653)  Physical Exam:  General: alert and cooperative Lochia: appropriate Uterine Fundus: firm Incision: honeycomb dressing noted with old drainage noted on bandage DVT Evaluation: No evidence of DVT seen on physical exam. Negative Homan's sign. No cords or calf tenderness. Calf/Ankle edema is present.   Recent Labs  11/27/13 0552 11/28/13 0634  HGB 10.1* 9.7*  HCT 30.5* 29.3*    Assessment/Plan: Status post Cesarean section. Doing well postoperatively.  Continue current care.  Dashanna Kinnamon G 11/28/2013, 8:35 AM

## 2013-11-29 MED ORDER — OXYCODONE-ACETAMINOPHEN 5-325 MG PO TABS: 1.0000 | ORAL_TABLET | ORAL | Status: AC | PRN

## 2013-11-29 MED ORDER — IBUPROFEN 800 MG PO TABS: 800.0000 mg | ORAL_TABLET | Freq: Three times a day (TID) | ORAL | Status: AC | PRN

## 2013-11-29 NOTE — Discharge Summary (Signed)
Obstetric Discharge Summary Reason for Admission: rupture of membranes Prenatal Procedures: ultrasound Intrapartum Procedures: cesarean: low cervical, transverse Postpartum Procedures: none Complications-Operative and Postpartum: none Hemoglobin  Date Value Ref Range Status  11/28/2013 9.7* 12.0 - 15.0 g/dL Final     HCT  Date Value Ref Range Status  11/28/2013 29.3* 36.0 - 46.0 % Final    Physical Exam:  General: alert and cooperative Lochia: appropriate Uterine Fundus: firm Incision: healing well DVT Evaluation: No evidence of DVT seen on physical exam. Negative Homan's sign. No cords or calf tenderness. Calf/Ankle edema is present.  Discharge Diagnoses: Term Pregnancy-delivered  Discharge Information: Date: 11/29/2013 Activity: pelvic rest Diet: routine Medications: PNV, Ibuprofen and Percocet Condition: stable Instructions: refer to practice specific booklet Discharge to: home   Newborn Data: Live born female  Birth Weight: 7 lb 12.2 oz (3520 g) APGAR: 8, 9  Home with mother.  CURTIS,CAROL G 11/29/2013, 8:26 AM

## 2013-11-29 NOTE — Lactation Note (Addendum)
This note was copied from the chart of Sara Liat Mayol. Lactation Consultation Note  Patient Name: Sara Macias [id number] Date: 11/29/2013 Reason for consult: Follow-up assessment per mom I've been sore both nipples so that is why I'm pumping  With the DEBP. LC assessed both breast and nipples with moms permission , positional strips noted both nipples  And scabs. LC reviewed sore nipple prevention and tx . mom has been using the comfort gels , and shells and DEBP.  Per mom desires to heal the nipples before relatching. Per mom has checked with insurance company and the her insurance doesn't cover DEBP .  Mom planned on renting and then decided to purchase a DEBP to work on especially milk supply . Moms milk is in and nodules noted bilaterally. LC suggested using the DEBP prior to D/C.  LC recommended  Working on healing her sore nipples and calling the [facility] office for a F/U apt for re-latching.   Maternal Data Formula Feeding for Exclusion: Yes Reason for exclusion: Mother's choice to formula and breast feed on admission Has patient been taught Hand Expression?: Yes Does the patient have breastfeeding experience prior to this delivery?: Yes  Feeding    LATCH Score/Interventions          Comfort (Breast/Nipple):  (positional strips both nipples and swelling )     Intervention(s): Breastfeeding basics reviewed     Lactation Tools Discussed/Used Pump Review: Setup, frequency, and cleaning   Consult Status Consult Status: Complete Date: 11/29/13 Follow-up type: In-patient    Kathrin Greathouse 11/29/2013, 11:45 AM

## 2013-12-12 ENCOUNTER — Encounter (HOSPITAL_COMMUNITY)
Admission: AD | Disposition: A | Discharge status: Home / Self Care | Payer: Self-pay | Source: Ambulatory Visit | Attending: Obstetrics and Gynecology

## 2013-12-12 SURGERY — Surgical Case
Anesthesia: Regional

## 2014-01-09 ENCOUNTER — Encounter (HOSPITAL_COMMUNITY): Payer: Self-pay | Admitting: Obstetrics and Gynecology

## 2014-01-10 ENCOUNTER — Other Ambulatory Visit: Payer: Self-pay | Admitting: Obstetrics and Gynecology

## 2014-01-11 LAB — CYTOLOGY - PAP

## 2016-02-15 IMAGING — US US OB DETAIL+14 WK
1 series · 12 of 28 positions shown · non-contrast
Comparison: none

[Series 1: us ob detail+14 wk · 12 of 61 slices shown]
[im 3/61]
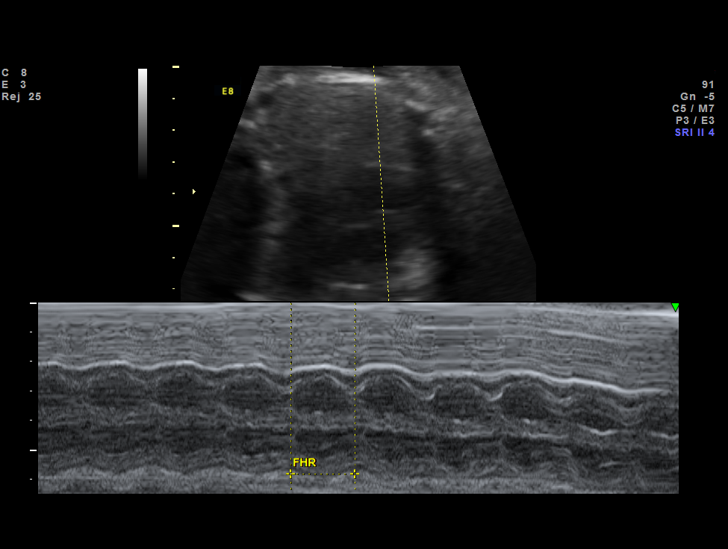
[im 7/61]
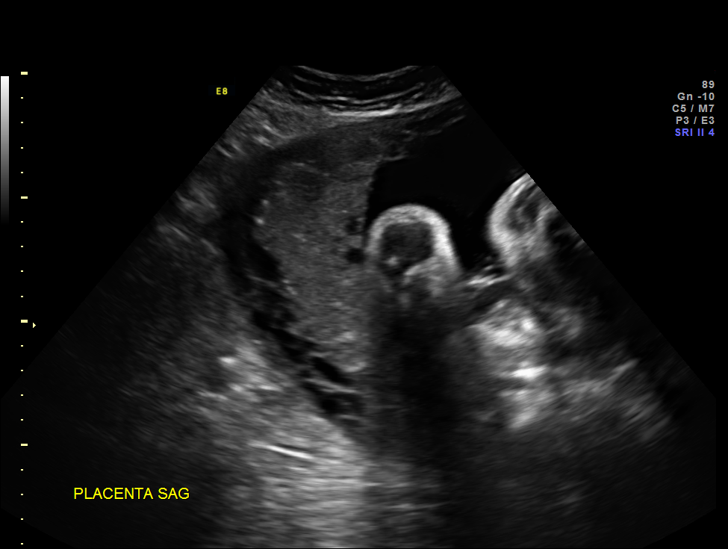
[im 12/61]
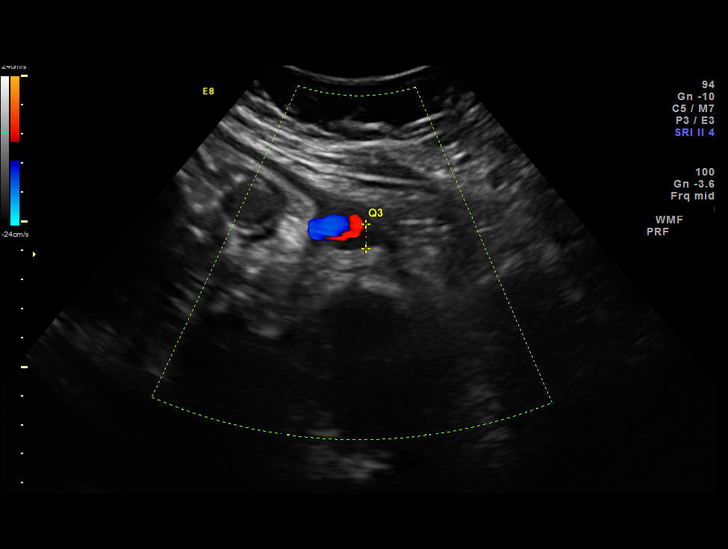
[im 18/61]
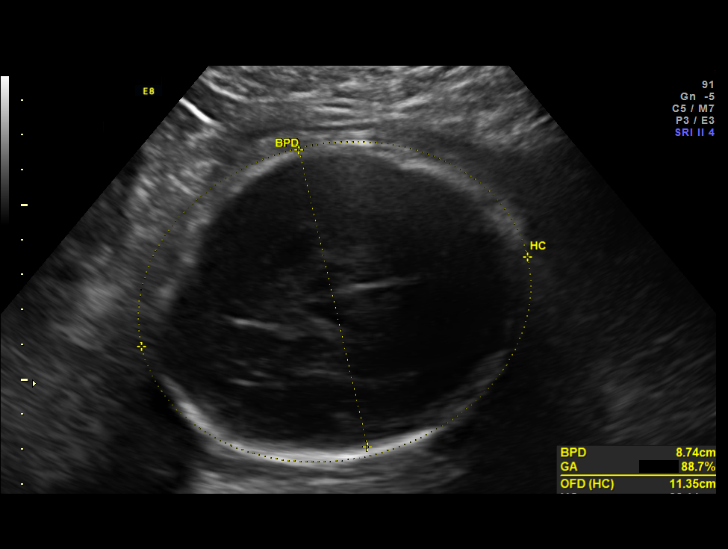
[im 23/61]
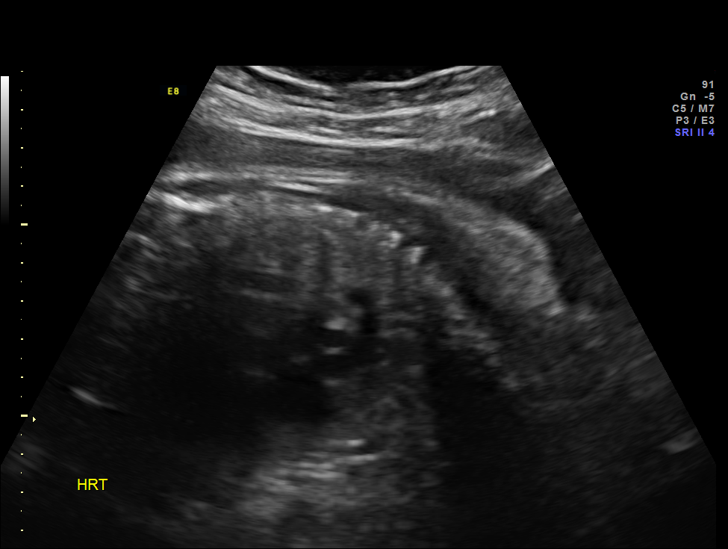
[im 27/61]
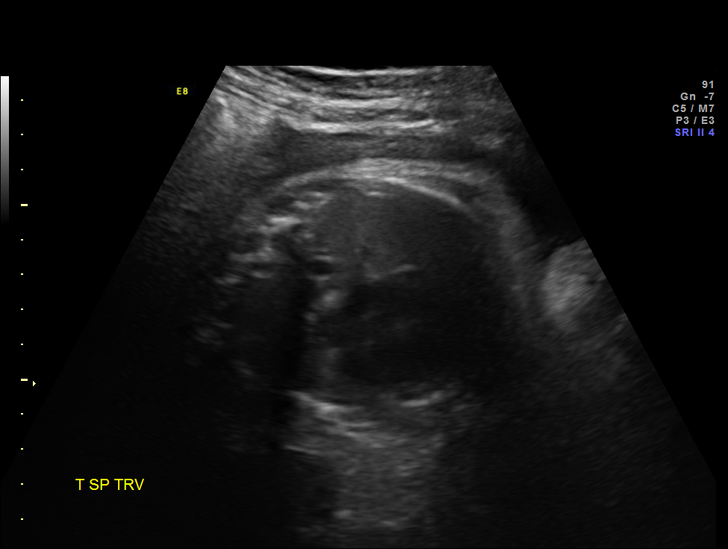
[im 34/61]
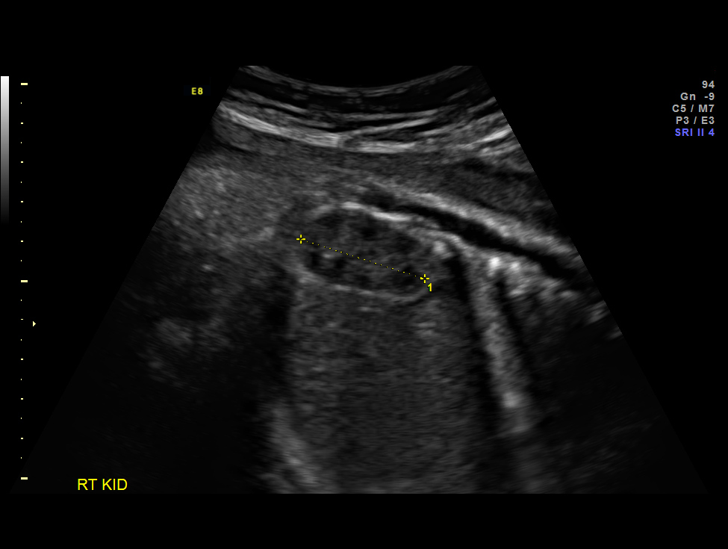
[im 38/61]
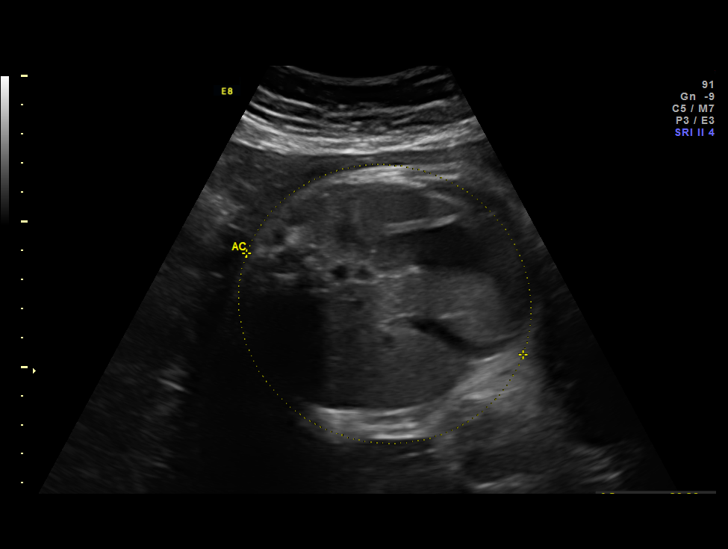
[im 43/61]
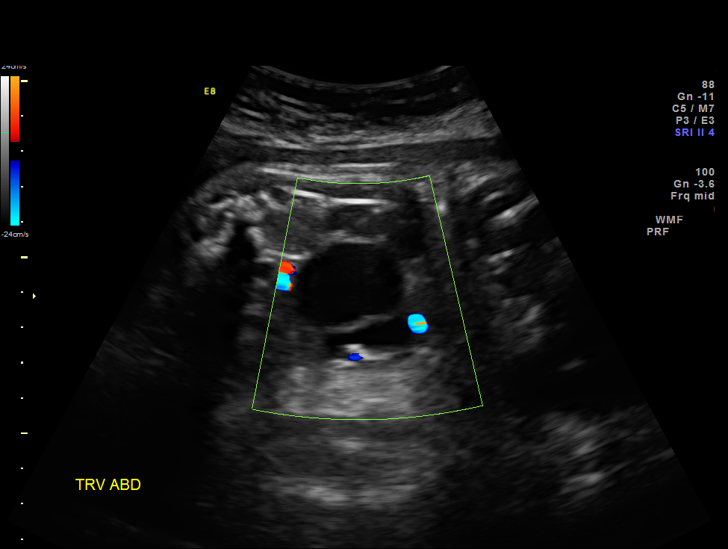
[im 49/61]
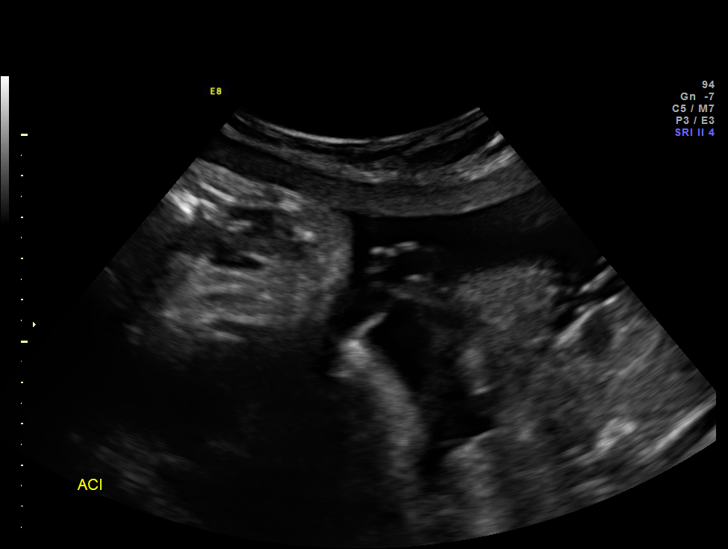
[im 54/61]
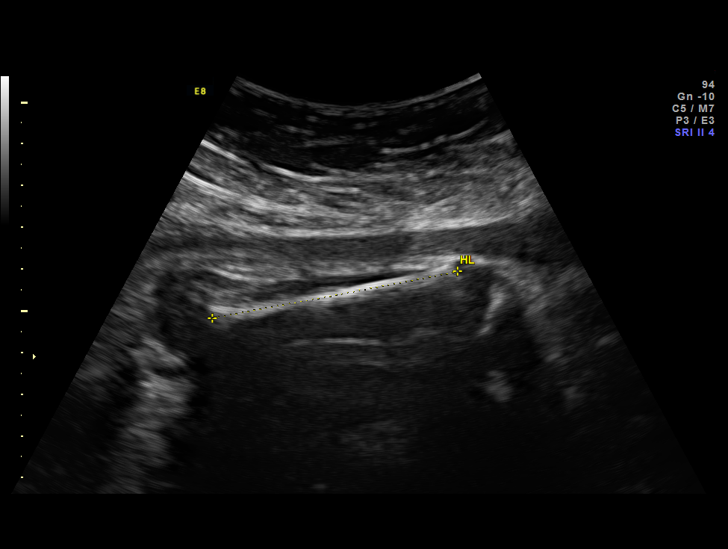
[im 58/61]
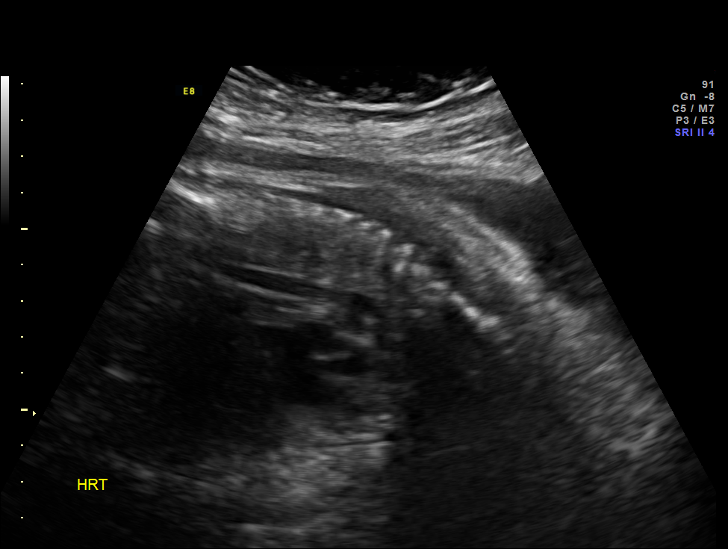

[12 of 28 positions shown; findings below may reference images not displayed]

OBSTETRICS REPORT
                      (Signed Final 11/04/2013 [DATE])

Service(s) Provided

 US OB DETAIL + 14 WK                                  76811.0
Indications

 Detailed fetal anatomic survey
 Diabetes - Gestational, A2 (on glyburide)
 Fetal abnormality - other known or suspected (cyst
 in pelvis)
Fetal Evaluation

 Num Of Fetuses:    1
 Fetal Heart Rate:  157                          bpm
 Cardiac Activity:  Observed
 Presentation:      Cephalic
 Placenta:          Posterior Fundal
 P. Cord            Not well visualized
 Insertion:

 Amniotic Fluid
 AFI FV:      Subjectively within normal limits
 AFI Sum:     11.1    cm       27  %Tile     Larg Pckt:     5.5  cm
 RUQ:   5.5     cm   RLQ:    3.23   cm    LUQ:   1.52    cm   LLQ:    0.85   cm
Biometry

 BPD:     87.8  mm     G. Age:  35w 4d                CI:         79.4   70 - 86
 OFD:    110.6  mm                                    FL/HC:      19.8   19.4 -

 HC:     317.2  mm     G. Age:  35w 4d       68  %    HC/AC:      1.02   0.96 -

 AC:     311.5  mm     G. Age:  35w 0d       89  %    FL/BPD:     71.6   71 - 87
 FL:      62.9  mm     G. Age:  32w 4d       17  %    FL/AC:      20.2   20 - 24
 HUM:       60  mm     G. Age:  34w 6d       82  %
 Est. FW:    6225  gm      5 lb 6 oz     76  %
Gestational Age

 LMP:           33w 4d        Date:  03/14/13                 EDD:   12/19/13
 U/S Today:     34w 5d                                        EDD:   12/11/13
 Best:          33w 4d     Det. By:  LMP  (03/14/13)          EDD:   12/19/13
Anatomy
 Cranium:          Appears normal         Aortic Arch:      Appears normal
 Fetal Cavum:      Appears normal         Ductal Arch:      Not well visualized
 Ventricles:       Appears normal         Diaphragm:        Appears normal
 Choroid Plexus:   Not well visualized    Stomach:          Appears normal, left
                                                            sided
 Cerebellum:       Not well visualized    Abdomen:          Abnormal, see
                                                            comments
 Posterior Fossa:  Not well visualized    Abdominal Wall:   Appears nml (cord
                                                            insert, abd wall)
 Nuchal Fold:      Not applicable (>20    Cord Vessels:     Appears normal (3
                   wks GA)                                  vessel cord)
 Face:             Not well visualized    Kidneys:          Appear normal
 Lips:             Appears normal         Bladder:          Appears normal
 Heart:            Not well visualized    Spine:            Appears normal
 RVOT:             Not well visualized    Lower             Visualized
                                          Extremities:
 LVOT:             Not well visualized    Upper             Visualized
                                          Extremities:

 Other:  Fetus appears to be a female. Technically difficult due to advanced
         GA and fetal position.
Cervix Uterus Adnexa

 Cervix:       Not visualized (advanced GA >82wks)

 Adnexa:     No abnormality visualized.
Impression

 SIUP at 33+4 weeks
 Cystic structure in lower left pelvis ([DATE] x 2.8 x 2.7 cms)
 adjacent to the bladder; simple, no solid components; no
 significant blood flow
 All other detailed fetal anatomy was seen and appeared
 normal; limited views of heart, PF and face
 Normal amniotic fluid volume
 Measurements consistent with LMP dating; EFW at the 76th
 %tile

 The US findings were shared with Ms. Ivery and her
 husband. The most likely diagnosis is an ovarian cyst. Others
 in the differential would originate from the GI tract. The
 implications of this cyst were discussed in detail. The fetus
 should do well for the remainder of the pregnancy. They were
 offered prenatal consultation with a pediatric surgeon and
 they declined. Newborn evaluation will probably determine
 the final diagnosis.
Recommendations

 Newborn evaluation

 questions or concerns.
